# Patient Record
Sex: Female | Born: 1960 | Race: White | Hispanic: No | Marital: Married | Smoking: Never smoker
Health system: Southern US, Community
[De-identification: ages and names within clinical notes are randomized; demographics above are authoritative.]

## PROBLEM LIST (undated history)

## (undated) DIAGNOSIS — E7800 Pure hypercholesterolemia, unspecified: Secondary | ICD-10-CM

## (undated) DIAGNOSIS — E559 Vitamin D deficiency, unspecified: Secondary | ICD-10-CM

## (undated) HISTORY — DX: Pure hypercholesterolemia, unspecified: E78.00

## (undated) HISTORY — PX: TONSILLECTOMY: SUR1361

---

## 2013-05-12 ENCOUNTER — Emergency Department (HOSPITAL_BASED_OUTPATIENT_CLINIC_OR_DEPARTMENT_OTHER)
Admission: EM | Admit: 2013-05-12 | Discharge: 2013-05-12 | Disposition: A | Discharge status: Home / Self Care | Payer: BC Managed Care – PPO | Attending: Emergency Medicine | Admitting: Emergency Medicine

## 2013-05-12 ENCOUNTER — Encounter (HOSPITAL_BASED_OUTPATIENT_CLINIC_OR_DEPARTMENT_OTHER): Payer: Self-pay | Admitting: Emergency Medicine

## 2013-05-12 ENCOUNTER — Emergency Department (HOSPITAL_BASED_OUTPATIENT_CLINIC_OR_DEPARTMENT_OTHER): Payer: BC Managed Care – PPO

## 2013-05-12 DIAGNOSIS — R079 Chest pain, unspecified: Secondary | ICD-10-CM | POA: Insufficient documentation

## 2013-05-12 DIAGNOSIS — K219 Gastro-esophageal reflux disease without esophagitis: Secondary | ICD-10-CM | POA: Insufficient documentation

## 2013-05-12 DIAGNOSIS — Z79899 Other long term (current) drug therapy: Secondary | ICD-10-CM | POA: Insufficient documentation

## 2013-05-12 DIAGNOSIS — M549 Dorsalgia, unspecified: Secondary | ICD-10-CM | POA: Insufficient documentation

## 2013-05-12 DIAGNOSIS — R209 Unspecified disturbances of skin sensation: Secondary | ICD-10-CM | POA: Insufficient documentation

## 2013-05-12 LAB — PROTIME-INR
INR: 0.95 (ref 0.00–1.49)
PROTHROMBIN TIME: 12.5 s (ref 11.6–15.2)

## 2013-05-12 LAB — APTT: APTT: 29 s (ref 24–37)

## 2013-05-12 LAB — BASIC METABOLIC PANEL
BUN: 9 mg/dL (ref 6–23)
CALCIUM: 10 mg/dL (ref 8.4–10.5)
CO2: 27 meq/L (ref 19–32)
Chloride: 101 mEq/L (ref 96–112)
Creatinine, Ser: 0.8 mg/dL (ref 0.50–1.10)
GFR calc Af Amer: >90 mL/min (ref >90)
GFR calc non Af Amer: 83 mL/min — ABNORMAL LOW (ref >90)
Glucose, Bld: 92 mg/dL (ref 70–99)
POTASSIUM: 3.8 meq/L (ref 3.7–5.3)
SODIUM: 142 meq/L (ref 137–147)

## 2013-05-12 LAB — TROPONIN I: Troponin I: <0.3 ng/mL (ref <0.30)

## 2013-05-12 LAB — CBC WITH DIFFERENTIAL/PLATELET
BASOS ABS: 0 10*3/uL (ref 0.0–0.1)
Basophils Relative: 0 % (ref 0–1)
Eosinophils Absolute: 0.2 10*3/uL (ref 0.0–0.7)
Eosinophils Relative: 3 % (ref 0–5)
HCT: 41.6 % (ref 36.0–46.0)
Hemoglobin: 13.7 g/dL (ref 12.0–15.0)
LYMPHS PCT: 36 % (ref 12–46)
Lymphs Abs: 2.7 10*3/uL (ref 0.7–4.0)
MCH: 30.5 pg (ref 26.0–34.0)
MCHC: 32.9 g/dL (ref 30.0–36.0)
MCV: 92.7 fL (ref 78.0–100.0)
MONOS PCT: 10 % (ref 3–12)
Monocytes Absolute: 0.7 10*3/uL (ref 0.1–1.0)
NEUTROS PCT: 51 % (ref 43–77)
Neutro Abs: 3.8 10*3/uL (ref 1.7–7.7)
PLATELETS: 301 10*3/uL (ref 150–400)
RBC: 4.49 MIL/uL (ref 3.87–5.11)
RDW: 12.8 % (ref 11.5–15.5)
WBC: 7.4 10*3/uL (ref 4.0–10.5)

## 2013-05-12 MED ORDER — ONDANSETRON 4 MG PO TBDP: 4.0000 mg | ORAL_TABLET | Freq: Three times a day (TID) | ORAL | Status: AC | PRN

## 2013-05-12 MED ORDER — GI COCKTAIL ~~LOC~~
30.0000 mL | Freq: Once | ORAL | Status: AC
  Administered 2013-05-12: 30 mL via ORAL
  Filled 2013-05-12: qty 30

## 2013-05-12 MED ORDER — PANTOPRAZOLE SODIUM 40 MG IV SOLR
40.0000 mg | Freq: Once | INTRAVENOUS | Status: AC
  Administered 2013-05-12: 40 mg via INTRAVENOUS
  Filled 2013-05-12: qty 40

## 2013-05-12 MED ORDER — OMEPRAZOLE 20 MG PO CPDR: 20.0000 mg | DELAYED_RELEASE_CAPSULE | Freq: Two times a day (BID) | ORAL | Status: AC

## 2013-05-12 NOTE — ED Notes (Signed)
Anterior chest tightness into her upper back x 1 hour. Her left arm is numb.

## 2013-05-12 NOTE — ED Provider Notes (Signed)
CSN: [id number]     Arrival date & time 05/12/13  1957 History  This chart was scribed for Rolland Porter, MD by Blanchard Kelch, ED Scribe. The patient was seen in room MH11/MH11. Patient's care was started at 8:27 PM.    Chief Complaint  Patient presents with  . Chest Pain    Patient is a 53 y.o. female presenting with chest pain. The history is provided by the patient. No language interpreter was used.  Chest Pain Associated symptoms: numbness   Associated symptoms: no abdominal pain, no cough, no diaphoresis, no dizziness, no dysphagia, no fatigue, no fever, no headache, no nausea, no shortness of breath and not vomiting     HPI Comments: Virginia Jensen is a 53 y.o. female who presents to the Emergency Department complaining of intermittent episodes of chest pain that first began two days ago. She states that the first episode occurred two days ago suddenly while she was bending over. She described the pain as a severe pressure in the middle of the chest that gradually subsided after fifteen minutes. She states that she sat down and changed positions which she believed caused relief. Yesterday was normal without episodes. She states that she felt a burning sensation today for about five minutes. She also started getting pain in her back between the shoulder blades and a numbness down her left arm that lasted for about an hour after eating baked chicken for dinner. She has a history of recurrent left back pain that also came back within the past few days. She states this pain is still present. She denies nausea, diaphoresis or becoming pale during the episodes. She denies any prior similar episodes of pain or issues with eating certain types of food. She denies a history of heart problems but reports significant history of family heart problems in her parents. She denies any heart problems in her siblings. She denies a past history diabetes or hypertension. She has been taking Sudafed for the past  three weeks. She was taking NSAIDs regularly for left ankle pain but stopped taking them on a regular basis in October. She drinks wine occasionally. She drinks caffeine everyday and has recently increased her caffeine dosage. She denies smoking, current or past. She states that she typically exercises by walking thirty minutes to an hour intermittently.    History reviewed. No pertinent past medical history. Past Surgical History  Procedure Laterality Date  . Tonsillectomy     No family history on file. History  Substance Use Topics  . Smoking status: Never Smoker   . Smokeless tobacco: Not on file  . Alcohol Use: Yes   OB History   Grav Para Term Preterm Abortions TAB SAB Ect Mult Living                 Review of Systems  Constitutional: Negative for fever, chills, diaphoresis, appetite change and fatigue.  HENT: Negative for mouth sores, sore throat and trouble swallowing.   Eyes: Negative for visual disturbance.  Respiratory: Negative for cough, chest tightness, shortness of breath and wheezing.   Cardiovascular: Positive for chest pain.  Gastrointestinal: Negative for nausea, vomiting, abdominal pain, diarrhea and abdominal distention.  Endocrine: Negative for polydipsia, polyphagia and polyuria.  Genitourinary: Negative for dysuria, frequency and hematuria.  Musculoskeletal: Negative for gait problem.  Skin: Negative for color change, pallor and rash.  Neurological: Positive for numbness. Negative for dizziness, syncope, light-headedness and headaches.  Hematological: Does not bruise/bleed easily.  Psychiatric/Behavioral: Negative for behavioral problems  and confusion.    Allergies  Review of patient's allergies indicates no known allergies.  Home Medications   Current Outpatient Rx  Name  Route  Sig  Dispense  Refill  . omeprazole (PRILOSEC) 20 MG capsule   Oral   Take 1 capsule (20 mg total) by mouth 2 (two) times daily.   60 capsule   0   . ondansetron  (ZOFRAN ODT) 4 MG disintegrating tablet   Oral   Take 1 tablet (4 mg total) by mouth every 8 (eight) hours as needed for nausea.   20 tablet   0    Triage Vitals: BP 152/65  Pulse 73  Temp(Src) 98.8 F (37.1 C) (Oral)  Resp 18  Ht 5\' 7"  (1.702 m)  Wt 175 lb (79.379 kg)  BMI 27.40 kg/m2  SpO2 100%  Physical Exam  Nursing note and vitals reviewed. Constitutional: She is oriented to person, place, and time. She appears well-developed and well-nourished. No distress.  HENT:  Head: Normocephalic.  Eyes: Conjunctivae are normal. Pupils are equal, round, and reactive to light. No scleral icterus.  Neck: Normal range of motion. Neck supple. No thyromegaly present.  No carotid bruit.  Cardiovascular: Normal rate, regular rhythm and normal heart sounds.  Exam reveals no gallop and no friction rub.   No murmur heard. Pulmonary/Chest: Effort normal and breath sounds normal. No respiratory distress. She has no wheezes. She has no rales.  Abdominal: Soft. Bowel sounds are normal. She exhibits no distension. There is no tenderness. There is no rebound.  Musculoskeletal: Normal range of motion.  Neurological: She is alert and oriented to person, place, and time.  Skin: Skin is warm and dry. No rash noted.  No zoster or ulcerations.  Psychiatric: She has a normal mood and affect. Her behavior is normal.    ED Course  Procedures (including critical care time)  DIAGNOSTIC STUDIES: Oxygen Saturation is 100% on room air, normal by my interpretation.    COORDINATION OF CARE: 8:41 PM -Will order chest x-ray, Protime-INR, APTT, Troponin I and BMP. Patient verbalizes understanding and agrees with treatment plan.    Labs Review Labs Reviewed  BASIC METABOLIC PANEL - Abnormal; Notable for the following:    GFR calc non Af Amer 83 (*)    All other components within normal limits  TROPONIN I  CBC WITH DIFFERENTIAL  PROTIME-INR  APTT  TROPONIN I   Imaging Review Dg Chest 2  View  05/12/2013   CLINICAL DATA:  Chest tightness.  Left upper extremity numbness.  EXAM: CHEST  2 VIEW  COMPARISON:  None.  FINDINGS: Cardiomediastinal silhouette unremarkable. Lungs clear. Bronchovascular markings normal. Pulmonary vascularity normal. No pneumothorax. No pleural effusions. Visualized bony thorax intact.  IMPRESSION: Normal examination.   Electronically Signed   By: Hulan Saashomas  Lawrence M.D.   On: 05/12/2013 21:00    EKG Interpretation   None       MDM   1. Chest pain   2. GERD (gastroesophageal reflux disease)     Negative enzymes. Normal resting EKG. Symptoms started with leaning over an sounds quite like reflux. She is low risk for this being cardiac. I recommend outpatient cardiac testing. We'll start her on proton pump inhibitors. Her given her careful precautions to return if any worsening or recurrence of symptoms.  I personally performed the services described in this documentation, which was scribed in my presence. The recorded information has been reviewed and is accurate.   Rolland PorterMark Kennia Vanvorst, MD 05/12/13 2329

## 2013-05-12 NOTE — Discharge Instructions (Signed)
Call [facility] Cardiology for follow up appointment. Return to emergency room if your symptoms worsen or progress or if you develop shortness of breath dizziness lightheadedness sweating. Your treatment is aimed at treating reflux. If your symptoms become different than those discussed today to recheck immediately.  Chest Pain (Nonspecific) It is often hard to give a specific diagnosis for the cause of chest pain. There is always a chance that your pain could be related to something serious, such as a heart attack or a blood clot in the lungs. You need to follow up with your caregiver for further evaluation. CAUSES   Heartburn.  Pneumonia or bronchitis.  Anxiety or stress.  Inflammation around your heart (pericarditis) or lung (pleuritis or pleurisy).  A blood clot in the lung.  A collapsed lung (pneumothorax). It can develop suddenly on its own (spontaneous pneumothorax) or from injury (trauma) to the chest.  Shingles infection (herpes zoster virus). The chest wall is composed of bones, muscles, and cartilage. Any of these can be the source of the pain.  The bones can be bruised by injury.  The muscles or cartilage can be strained by coughing or overwork.  The cartilage can be affected by inflammation and become sore (costochondritis). DIAGNOSIS  Lab tests or other studies, such as X-rays, electrocardiography, stress testing, or cardiac imaging, may be needed to find the cause of your pain.  TREATMENT   Treatment depends on what may be causing your chest pain. Treatment may include:  Acid blockers for heartburn.  Anti-inflammatory medicine.  Pain medicine for inflammatory conditions.  Antibiotics if an infection is present.  You may be advised to change lifestyle habits. This includes stopping smoking and avoiding alcohol, caffeine, and chocolate.  You may be advised to keep your head raised (elevated) when sleeping. This reduces the chance of acid going backward from your  stomach into your esophagus.  Most of the time, nonspecific chest pain will improve within 2 to 3 days with rest and mild pain medicine. HOME CARE INSTRUCTIONS   If antibiotics were prescribed, take your antibiotics as directed. Finish them even if you start to feel better.  For the next few days, avoid physical activities that bring on chest pain. Continue physical activities as directed.  Do not smoke.  Avoid drinking alcohol.  Only take over-the-counter or prescription medicine for pain, discomfort, or fever as directed by your caregiver.  Follow your caregiver's suggestions for further testing if your chest pain does not go away.  Keep any follow-up appointments you made. If you do not go to an appointment, you could develop lasting (chronic) problems with pain. If there is any problem keeping an appointment, you must call to reschedule. SEEK MEDICAL CARE IF:   You think you are having problems from the medicine you are taking. Read your medicine instructions carefully.  Your chest pain does not go away, even after treatment.  You develop a rash with blisters on your chest. SEEK IMMEDIATE MEDICAL CARE IF:   You have increased chest pain or pain that spreads to your arm, neck, jaw, back, or abdomen.  You develop shortness of breath, an increasing cough, or you are coughing up blood.  You have severe back or abdominal pain, feel nauseous, or vomit.  You develop severe weakness, fainting, or chills.  You have a fever. THIS IS AN EMERGENCY. Do not wait to see if the pain will go away. Get medical help at once. Call your local emergency services (911 in [place] Do not  drive yourself to the hospital. MAKE SURE YOU:   Understand these instructions.  Will watch your condition.  Will get help right away if you are not doing well or get worse. Document Released: 01/07/2005 Document Revised: 06/22/2011 Document Reviewed: 11/03/2007 [facility] Patient Information 2014 [place],  [place].  Diet for Gastroesophageal Reflux Disease, Adult Reflux (acid reflux) is when acid from your stomach flows up into the esophagus. When acid comes in contact with the esophagus, the acid causes irritation and soreness (inflammation) in the esophagus. When reflux happens often or so severely that it causes damage to the esophagus, it is called gastroesophageal reflux disease (GERD). Nutrition therapy can help ease the discomfort of GERD. FOODS OR DRINKS TO AVOID OR LIMIT  Smoking or chewing tobacco. Nicotine is one of the most potent stimulants to acid production in the gastrointestinal tract.  Caffeinated and decaffeinated coffee and black tea.  Regular or low-calorie carbonated beverages or energy drinks (caffeine-free carbonated beverages are allowed).   Strong spices, such as black pepper, white pepper, red pepper, cayenne, curry powder, and chili powder.  Peppermint or spearmint.  Chocolate.  High-fat foods, including meats and fried foods. Extra added fats including oils, butter, salad dressings, and nuts. Limit these to less than 8 tsp per day.  Fruits and vegetables if they are not tolerated, such as citrus fruits or tomatoes.  Alcohol.  Any food that seems to aggravate your condition. If you have questions regarding your diet, call your caregiver or a registered dietitian. OTHER THINGS THAT MAY HELP GERD INCLUDE:   Eating your meals slowly, in a relaxed setting.  Eating 5 to 6 small meals per day instead of 3 large meals.  Eliminating food for a period of time if it causes distress.  Not lying down until 3 hours after eating a meal.  Keeping the head of your bed raised 6 to 9 inches (15 to 23 cm) by using a foam wedge or blocks under the legs of the bed. Lying flat may make symptoms worse.  Being physically active. Weight loss may be helpful in reducing reflux in overweight or obese adults.  Wear loose fitting clothing EXAMPLE MEAL PLAN This meal plan is  approximately 2,000 calories based on https://www.bernard.org/ meal planning guidelines. Breakfast   cup cooked oatmeal.  1 cup strawberries.  1 cup low-fat milk.  1 oz almonds. Snack  1 cup cucumber slices.  6 oz yogurt (made from low-fat or fat-free milk). Lunch  2 slice whole-wheat bread.  2 oz sliced [place].  2 tsp mayonnaise.  1 cup blueberries.  1 cup snap peas. Snack  6 whole-wheat crackers.  1 oz string cheese. Dinner   cup brown rice.  1 cup mixed veggies.  1 tsp olive oil.  3 oz grilled fish. Document Released: 03/30/2005 Document Revised: 06/22/2011 Document Reviewed: 02/13/2011 [facility] Patient Information 2014 [place], [place].  Gastroesophageal Reflux Disease, Adult Gastroesophageal reflux disease (GERD) happens when acid from your stomach flows up into the esophagus. When acid comes in contact with the esophagus, the acid causes soreness (inflammation) in the esophagus. Over time, GERD may create small holes (ulcers) in the lining of the esophagus. CAUSES   Increased body weight. This puts pressure on the stomach, making acid rise from the stomach into the esophagus.  Smoking. This increases acid production in the stomach.  Drinking alcohol. This causes decreased pressure in the lower esophageal sphincter (valve or ring of muscle between the esophagus and stomach), allowing acid from the stomach into the  esophagus.  Late evening meals and a full stomach. This increases pressure and acid production in the stomach.  A malformed lower esophageal sphincter. Sometimes, no cause is found. SYMPTOMS   Burning pain in the lower part of the mid-chest behind the breastbone and in the mid-stomach area. This may occur twice a week or more often.  Trouble swallowing.  Sore throat.  Dry cough.  Asthma-like symptoms including chest tightness, shortness of breath, or wheezing. DIAGNOSIS  Your caregiver may be able to diagnose GERD based on your symptoms.  In some cases, X-rays and other tests may be done to check for complications or to check the condition of your stomach and esophagus. TREATMENT  Your caregiver may recommend over-the-counter or prescription medicines to help decrease acid production. Ask your caregiver before starting or adding any new medicines.  HOME CARE INSTRUCTIONS   Change the factors that you can control. Ask your caregiver for guidance concerning weight loss, quitting smoking, and alcohol consumption.  Avoid foods and drinks that make your symptoms worse, such as:  Caffeine or alcoholic drinks.  Chocolate.  Peppermint or mint flavorings.  Garlic and onions.  Spicy foods.  Citrus fruits, such as oranges, lemons, or limes.  Tomato-based foods such as sauce, chili, salsa, and pizza.  Fried and fatty foods.  Avoid lying down for the 3 hours prior to your bedtime or prior to taking a nap.  Eat small, frequent meals instead of large meals.  Wear loose-fitting clothing. Do not wear anything tight around your waist that causes pressure on your stomach.  Raise the head of your bed 6 to 8 inches with wood blocks to help you sleep. Extra pillows will not help.  Only take over-the-counter or prescription medicines for pain, discomfort, or fever as directed by your caregiver.  Do not take aspirin, ibuprofen, or other nonsteroidal anti-inflammatory drugs (NSAIDs). SEEK IMMEDIATE MEDICAL CARE IF:   You have pain in your arms, neck, jaw, teeth, or back.  Your pain increases or changes in intensity or duration.  You develop nausea, vomiting, or sweating (diaphoresis).  You develop shortness of breath, or you faint.  Your vomit is green, yellow, black, or looks like coffee grounds or blood.  Your stool is red, bloody, or black. These symptoms could be signs of other problems, such as heart disease, gastric bleeding, or esophageal bleeding. MAKE SURE YOU:   Understand these instructions.  Will watch your  condition.  Will get help right away if you are not doing well or get worse. Document Released: 01/07/2005 Document Revised: 06/22/2011 Document Reviewed: 10/17/2010 [facility] Patient Information 2014 [place].

## 2013-05-12 NOTE — ED Notes (Signed)
Patient transported to X-ray 

## 2015-03-09 IMAGING — CR DG CHEST 2V
2 series · 2 of 2 positions shown · non-contrast
Comparison: None.

CLINICAL DATA: Chest tightness.  Left upper extremity numbness.

EXAM:
CHEST  2 VIEW

[w chest pa]
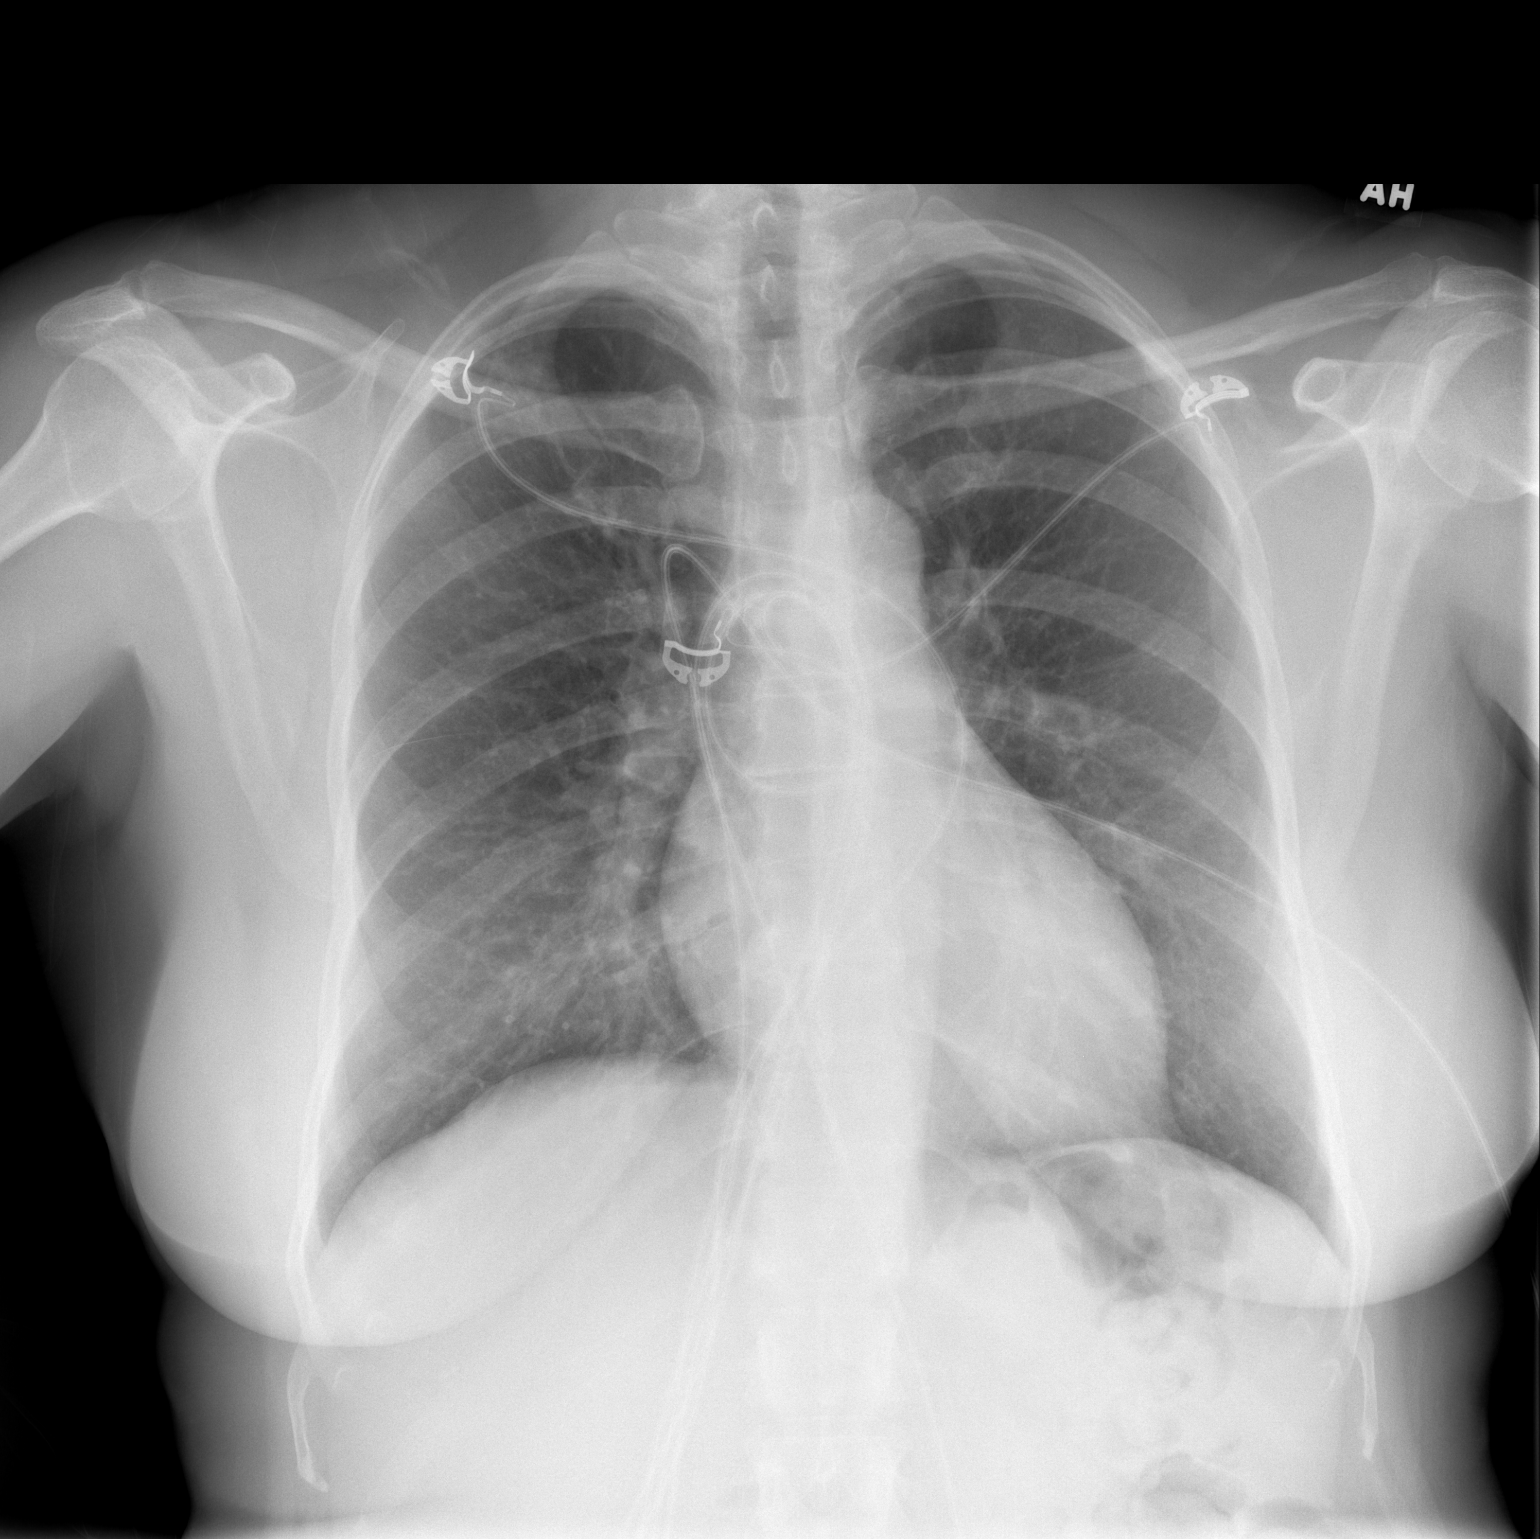

[w chest lat]
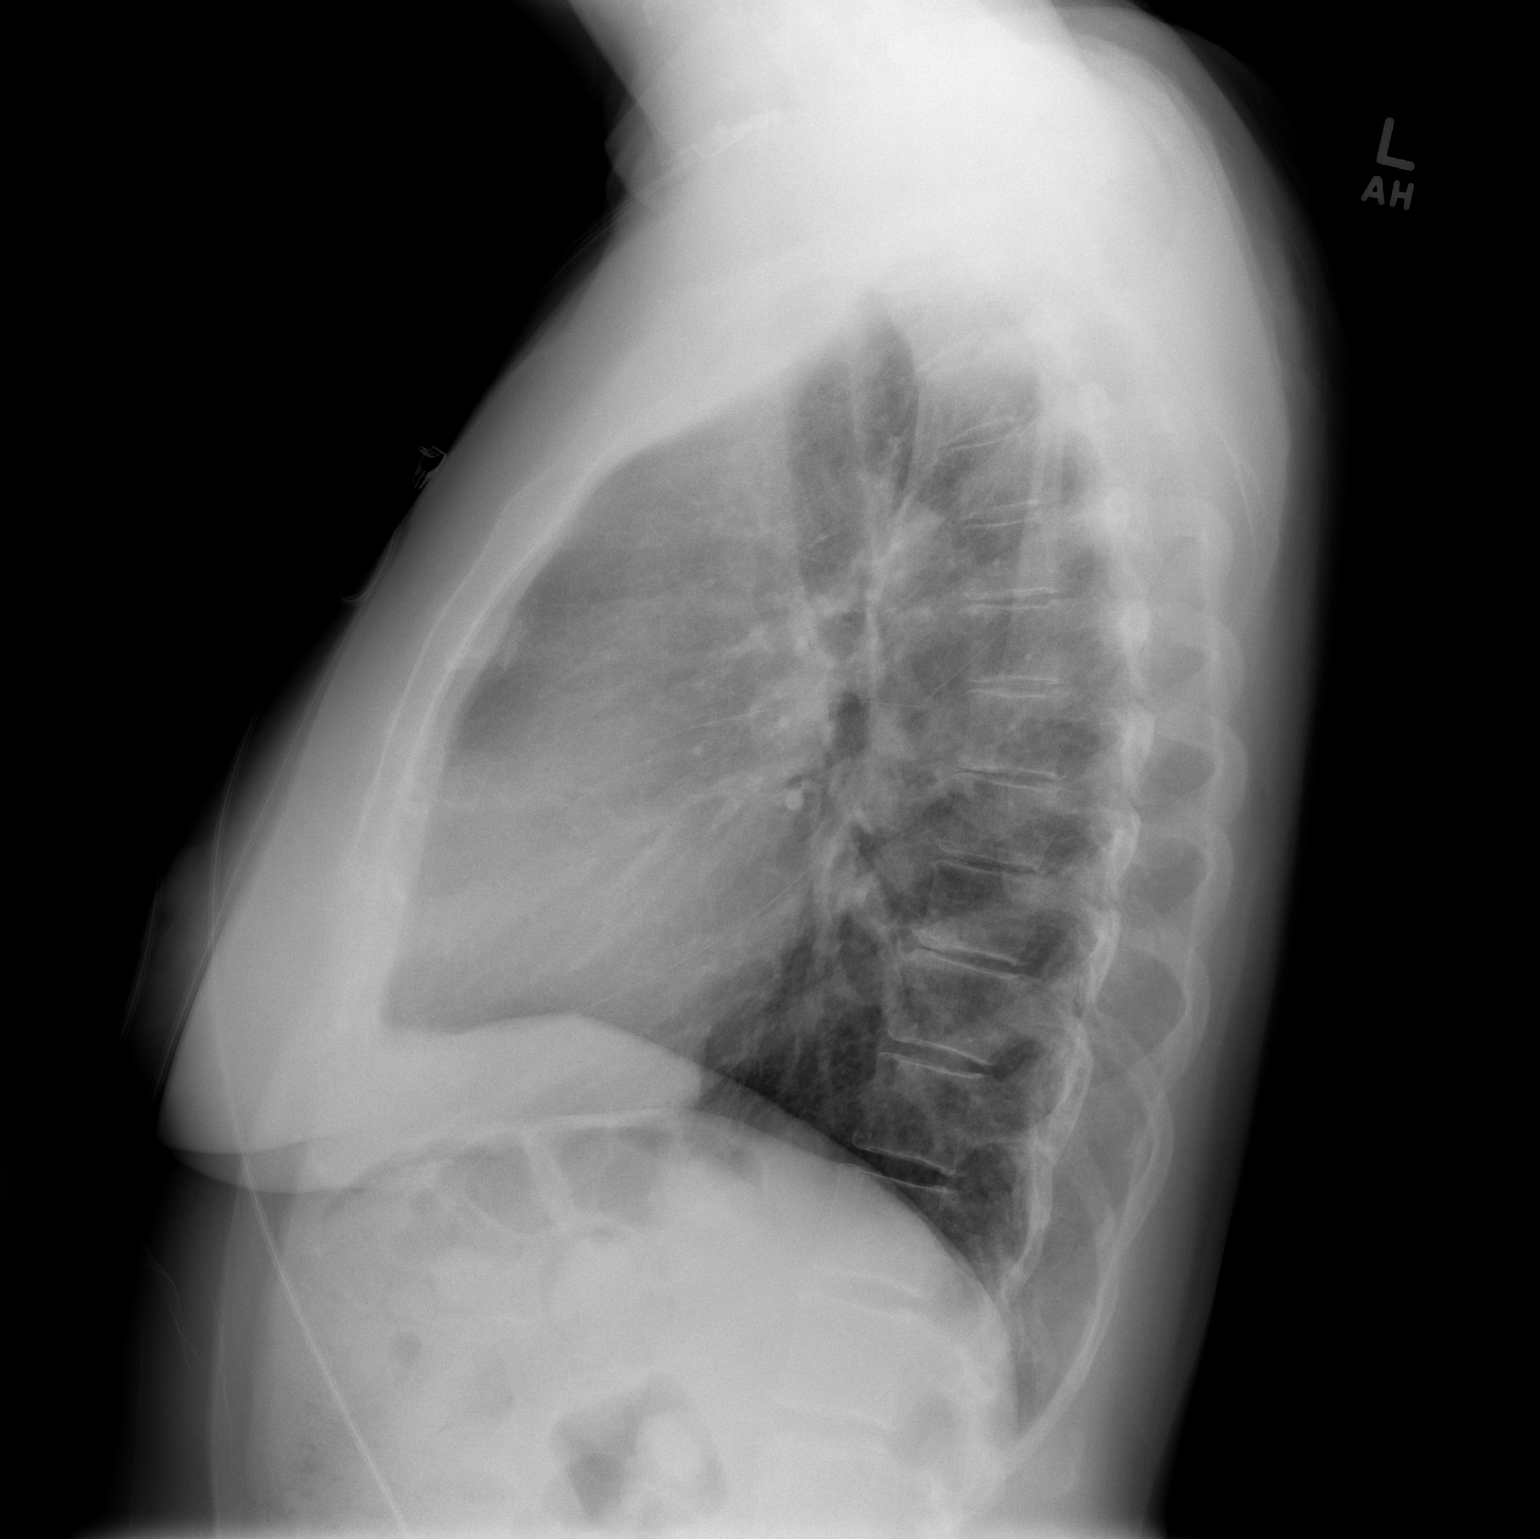

[2 of 2 positions shown; findings below may reference images not displayed]

FINDINGS: Cardiomediastinal silhouette unremarkable. Lungs clear.
Bronchovascular markings normal. Pulmonary vascularity normal. No
pneumothorax. No pleural effusions. Visualized bony thorax intact.
IMPRESSION: Normal examination.

## 2016-05-12 ENCOUNTER — Other Ambulatory Visit (HOSPITAL_COMMUNITY): Payer: Self-pay | Admitting: Orthopedic Surgery

## 2016-05-12 DIAGNOSIS — M79605 Pain in left leg: Secondary | ICD-10-CM

## 2016-05-12 DIAGNOSIS — M7989 Other specified soft tissue disorders: Principal | ICD-10-CM

## 2016-05-13 ENCOUNTER — Ambulatory Visit (HOSPITAL_COMMUNITY)
Admission: RE | Admit: 2016-05-13 | Discharge: 2016-05-13 | Disposition: A | Discharge status: Home / Self Care | Payer: BLUE CROSS/BLUE SHIELD | Source: Ambulatory Visit | Attending: Orthopedic Surgery | Admitting: Orthopedic Surgery

## 2016-05-13 DIAGNOSIS — M79605 Pain in left leg: Secondary | ICD-10-CM | POA: Insufficient documentation

## 2016-05-13 DIAGNOSIS — M7989 Other specified soft tissue disorders: Secondary | ICD-10-CM | POA: Diagnosis present

## 2016-05-13 DIAGNOSIS — M7122 Synovial cyst of popliteal space [Baker], left knee: Secondary | ICD-10-CM | POA: Diagnosis not present

## 2016-05-13 NOTE — Progress Notes (Signed)
*  PRELIMINARY RESULTS* Vascular Ultrasound Left lower extremity venous duplex has been completed.  Preliminary findings: no evidence of DVT left baker's cyst noted.   Called results to Dr. Eulah PontMurphy.  Farrel DemarkJill Eunice, RDMS, RVT  05/13/2016, 10:20 AM

## 2019-04-28 ENCOUNTER — Encounter (HOSPITAL_BASED_OUTPATIENT_CLINIC_OR_DEPARTMENT_OTHER): Payer: Self-pay

## 2019-04-28 ENCOUNTER — Emergency Department (HOSPITAL_BASED_OUTPATIENT_CLINIC_OR_DEPARTMENT_OTHER)
Admission: EM | Admit: 2019-04-28 | Discharge: 2019-04-28 | Disposition: A | Discharge status: Home / Self Care | Payer: BLUE CROSS/BLUE SHIELD | Attending: Emergency Medicine | Admitting: Emergency Medicine

## 2019-04-28 ENCOUNTER — Emergency Department (HOSPITAL_BASED_OUTPATIENT_CLINIC_OR_DEPARTMENT_OTHER): Payer: BLUE CROSS/BLUE SHIELD

## 2019-04-28 ENCOUNTER — Other Ambulatory Visit: Payer: Self-pay

## 2019-04-28 DIAGNOSIS — Z79899 Other long term (current) drug therapy: Secondary | ICD-10-CM | POA: Diagnosis not present

## 2019-04-28 DIAGNOSIS — R1032 Left lower quadrant pain: Secondary | ICD-10-CM

## 2019-04-28 DIAGNOSIS — N390 Urinary tract infection, site not specified: Secondary | ICD-10-CM | POA: Diagnosis not present

## 2019-04-28 HISTORY — DX: Vitamin D deficiency, unspecified: E55.9

## 2019-04-28 LAB — CBC WITH DIFFERENTIAL/PLATELET
Abs Immature Granulocytes: 0.01 10*3/uL (ref 0.00–0.07)
Basophils Absolute: 0 10*3/uL (ref 0.0–0.1)
Basophils Relative: 1 %
Eosinophils Absolute: 0.2 10*3/uL (ref 0.0–0.5)
Eosinophils Relative: 3 %
HCT: 39.5 % (ref 36.0–46.0)
Hemoglobin: 12.9 g/dL (ref 12.0–15.0)
Immature Granulocytes: 0 %
Lymphocytes Relative: 33 %
Lymphs Abs: 2.2 10*3/uL (ref 0.7–4.0)
MCH: 30.4 pg (ref 26.0–34.0)
MCHC: 32.7 g/dL (ref 30.0–36.0)
MCV: 93.2 fL (ref 80.0–100.0)
Monocytes Absolute: 0.7 10*3/uL (ref 0.1–1.0)
Monocytes Relative: 11 %
Neutro Abs: 3.4 10*3/uL (ref 1.7–7.7)
Neutrophils Relative %: 52 %
Platelets: 274 10*3/uL (ref 150–400)
RBC: 4.24 MIL/uL (ref 3.87–5.11)
RDW: 12.4 % (ref 11.5–15.5)
WBC: 6.5 10*3/uL (ref 4.0–10.5)
nRBC: 0 % (ref 0.0–0.2)

## 2019-04-28 LAB — COMPREHENSIVE METABOLIC PANEL
ALT: 22 U/L (ref 0–44)
AST: 16 U/L (ref 15–41)
Albumin: 4.2 g/dL (ref 3.5–5.0)
Alkaline Phosphatase: 50 U/L (ref 38–126)
Anion gap: 7 (ref 5–15)
BUN: 13 mg/dL (ref 6–20)
CO2: 27 mmol/L (ref 22–32)
Calcium: 9.2 mg/dL (ref 8.9–10.3)
Chloride: 106 mmol/L (ref 98–111)
Creatinine, Ser: 0.65 mg/dL (ref 0.44–1.00)
GFR calc Af Amer: >60 mL/min (ref >60)
GFR calc non Af Amer: >60 mL/min (ref >60)
Glucose, Bld: 122 mg/dL — ABNORMAL HIGH (ref 70–99)
Potassium: 3.6 mmol/L (ref 3.5–5.1)
Sodium: 140 mmol/L (ref 135–145)
Total Bilirubin: 0.4 mg/dL (ref 0.3–1.2)
Total Protein: 7 g/dL (ref 6.5–8.1)

## 2019-04-28 LAB — URINALYSIS, ROUTINE W REFLEX MICROSCOPIC
Bilirubin Urine: NEGATIVE
Glucose, UA: NEGATIVE mg/dL
Hgb urine dipstick: NEGATIVE
Ketones, ur: NEGATIVE mg/dL
Nitrite: NEGATIVE
Protein, ur: NEGATIVE mg/dL
Specific Gravity, Urine: 1.02 (ref 1.005–1.030)
pH: 7.5 (ref 5.0–8.0)

## 2019-04-28 LAB — URINALYSIS, MICROSCOPIC (REFLEX): RBC / HPF: NONE SEEN RBC/hpf (ref 0–5)

## 2019-04-28 LAB — LIPASE, BLOOD: Lipase: 24 U/L (ref 11–51)

## 2019-04-28 MED ORDER — CEPHALEXIN 500 MG PO CAPS: 1000.0000 mg | ORAL_CAPSULE | Freq: Two times a day (BID) | ORAL | 0 refills | Status: AC

## 2019-04-28 MED ORDER — IOHEXOL 300 MG/ML  SOLN
100.0000 mL | Freq: Once | INTRAMUSCULAR | Status: AC | PRN
  Administered 2019-04-28: 100 mL via INTRAVENOUS

## 2019-04-28 MED ORDER — SODIUM CHLORIDE 0.9 % IV BOLUS
500.0000 mL | Freq: Once | INTRAVENOUS | Status: AC
  Administered 2019-04-28: 500 mL via INTRAVENOUS

## 2019-04-28 MED ORDER — CEPHALEXIN 250 MG PO CAPS
1000.0000 mg | ORAL_CAPSULE | Freq: Once | ORAL | Status: AC
  Administered 2019-04-28: 23:00:00 1000 mg via ORAL
  Filled 2019-04-28: qty 4

## 2019-04-28 NOTE — ED Notes (Signed)
Pt ambulated to bathroom with steady gait. 

## 2019-04-28 NOTE — ED Provider Notes (Signed)
[facility] EMERGENCY DEPARTMENT Provider Note   CSN: [id number] Arrival date & time: 04/28/19  1618     History Chief Complaint  Patient presents with  . Abdominal Pain    Virginia Jensen is a 59 y.o. female.  HPI Patient had a colonoscopy 2 days ago.  She has been having left lower quadrant pain that has persisted and worsened.  No associated symptoms.  Patient has not had a bowel movement yet.  No vomiting.  No fever.  No urinary symptoms.  Patient was sent by her gastroenterologist for ED evaluation.    Past Medical History:  Diagnosis Date  . Vitamin D deficiency     There are no problems to display for this patient.   Past Surgical History:  Procedure Laterality Date  . TONSILLECTOMY       OB History   No obstetric history on file.     No family history on file.  Social History   Tobacco Use  . Smoking status: Never Smoker  . Smokeless tobacco: Never Used  Substance Use Topics  . Alcohol use: Yes    Comment: occ  . Drug use: No    Home Medications Prior to Admission medications   Medication Sig Start Date End Date Taking? Authorizing Provider  cephALEXin (KEFLEX) 500 MG capsule Take 2 capsules (1,000 mg total) by mouth 2 (two) times daily. 04/28/19   Arby Barrette, MD  omeprazole (PRILOSEC) 20 MG capsule Take 1 capsule (20 mg total) by mouth 2 (two) times daily. 05/12/13   Rolland Porter, MD  ondansetron (ZOFRAN ODT) 4 MG disintegrating tablet Take 1 tablet (4 mg total) by mouth every 8 (eight) hours as needed for nausea. 05/12/13   Rolland Porter, MD    Allergies    Patient has no known allergies.  Review of Systems   Review of Systems 10 Systems reviewed and are negative for acute change except as noted in the HPI.  Physical Exam Updated Vital Signs BP 126/68 (BP Location: Right Arm)   Pulse 67   Temp 98.5 F (36.9 C) (Oral)   Resp 20   SpO2 100%   Physical Exam Constitutional:      Appearance: She is well-developed.  HENT:    Head: Normocephalic and atraumatic.  Cardiovascular:     Rate and Rhythm: Normal rate and regular rhythm.     Heart sounds: Normal heart sounds.  Pulmonary:     Effort: Pulmonary effort is normal.     Breath sounds: Normal breath sounds.  Abdominal:     General: Bowel sounds are normal. There is no distension.     Palpations: Abdomen is soft.     Tenderness: There is abdominal tenderness.     Comments: Left lower quadrant tender to palpation.  No guarding.  Musculoskeletal:        General: Normal range of motion.     Cervical back: Neck supple.     Right lower leg: No edema.     Left lower leg: No edema.  Skin:    General: Skin is warm and dry.  Neurological:     Mental Status: She is alert and oriented to person, place, and time.     GCS: GCS eye subscore is 4. GCS verbal subscore is 5. GCS motor subscore is 6.     Coordination: Coordination normal.     ED Results / Procedures / Treatments   Labs (all labs ordered are listed, but only abnormal results are displayed) Labs Reviewed  URINE CULTURE - Abnormal; Notable for the following components:      Result Value   Culture MULTIPLE SPECIES PRESENT, SUGGEST RECOLLECTION (*)    All other components within normal limits  COMPREHENSIVE METABOLIC PANEL - Abnormal; Notable for the following components:   Glucose, Bld 122 (*)    All other components within normal limits  URINALYSIS, ROUTINE W REFLEX MICROSCOPIC - Abnormal; Notable for the following components:   APPearance CLOUDY (*)    Leukocytes,Ua TRACE (*)    All other components within normal limits  URINALYSIS, MICROSCOPIC (REFLEX) - Abnormal; Notable for the following components:   Bacteria, UA MANY (*)    All other components within normal limits  LIPASE, BLOOD  CBC WITH DIFFERENTIAL/PLATELET    EKG None  Radiology No results found.  Procedures Procedures (including critical care time)  Medications Ordered in ED Medications  sodium chloride 0.9 % bolus 500  mL (0 mLs Intravenous Stopped 04/28/19 2015)  iohexol (OMNIPAQUE) 300 MG/ML solution 100 mL (100 mLs Intravenous Contrast Given 04/28/19 2054)  cephALEXin (KEFLEX) capsule 1,000 mg (1,000 mg Oral Given 04/28/19 2324)    ED Course  I have reviewed the triage vital signs and the nursing notes.  Pertinent labs & imaging results that were available during my care of the patient were reviewed by me and considered in my medical decision making (see chart for details).    MDM Rules/Calculators/A&P                      Patient has persisting and worsening left lower abdominal pain 2 days post colonoscopy.  Clinically she is well in appearance.  Abdomen is soft without guarding but does have reproducible left lower quadrant pain.  We will proceed with CT abdomen to rule out any post colonoscopy complications or other etiologies.  CT identifies no acute findings.  Patient is clinically well without fevers or vomiting or diarrhea.  Urinalysis suggestive of UTI however, specimen does contain fair amount of squamous epithelial cells and may represent contamination.  Will proceed with urine culture and treat empirically with Keflex due to patient having lower abdominal pain without other identifiable etiology.  Patient counseled on UA findings and anticipated culture in 24 to 48 hours.  Advised to contact her gynecologist or GI doctor to review results of culture and determine necessity of ongoing antibiotic use.  Patient was very dismayed as there was no definitive etiology for her abdominal pain.  We discussed the sometimes ambiguous nature of abdominal pain, and the process of ruling out serious etiologies, while continuing diagnostic evaluation with her providers, and return precautions for return to the emergency department. Final Clinical Impression(s) / ED Diagnoses Final diagnoses:  Left lower quadrant abdominal pain  Lower urinary tract infectious disease    Rx / DC Orders ED Discharge Orders          Ordered    cephALEXin (KEFLEX) 500 MG capsule  2 times daily     04/28/19 2304           Charlesetta Shanks, MD 05/03/19 [id number]

## 2019-04-28 NOTE — ED Triage Notes (Signed)
Pt c/o LLQ pain that started after a colonoscopy 2 days ago-states she was sent by [facility]-steady gait

## 2019-04-28 NOTE — Discharge Instructions (Addendum)
1.  Take Keflex as prescribed.  Your first dose was given in the emergency department.  You do not need to start it until tomorrow.  This is an antibiotic for urinary tract infection. 2.  Urine cultures are obtained to confirm urinary tract infections and identify what kind of bacteria are causing them.  You should notify your gynecologist or gastroenterologist to follow-up on these results which will be available in 24 to 48 hours.  You have been given antibiotics through the emergency department so you will not be called automatically with the results of urine culture. 3.  Follow-up with your gastroenterologist for recheck for abdominal pain.

## 2019-04-30 LAB — URINE CULTURE

## 2020-05-17 ENCOUNTER — Other Ambulatory Visit: Payer: Self-pay

## 2020-05-17 ENCOUNTER — Encounter: Payer: Self-pay | Admitting: Neurology

## 2020-05-17 DIAGNOSIS — R202 Paresthesia of skin: Secondary | ICD-10-CM

## 2020-07-03 ENCOUNTER — Other Ambulatory Visit: Payer: Self-pay

## 2020-07-03 ENCOUNTER — Ambulatory Visit: Payer: BC Managed Care – PPO | Admitting: Neurology

## 2020-07-03 DIAGNOSIS — R202 Paresthesia of skin: Secondary | ICD-10-CM

## 2020-07-03 NOTE — Procedures (Signed)
[facility]  [address], [address]  [place], [place] [address] Tel: [phone] Fax:  [phone] Test Date:  07/03/2020  Patient: Virginia Jensen DOB: 07/29/1960 Physician: Avonlea Sima, DO  Sex: Female Height: 5' 7" Ref Phys: W.Dan Caffrey, MD  ID#: [id number]   Technician:    Patient Complaints: This is a 59-year-old female referred for evaluation of right hand paresthesias.  NCV & EMG Findings: Extensive electrodiagnostic testing of the right upper extremity shows:  1. Right median, ulnar, and mixed palmar sensory responses are within normal limits. 2. Right median and ulnar motor responses are within normal limits. 3. There is no evidence of active or chronic motor axonal loss changes affecting any of the tested muscles.  Motor unit configuration and recruitment pattern is within normal limits.  Impression: This is a normal study of the right upper extremity.  In particular, there is no evidence of carpal tunnel syndrome, ulnar neuropathy, or a cervical radiculopathy.   ___________________________ Aum Caggiano, DO    Nerve Conduction Studies Anti Sensory Summary Table   Stim Site NR Peak (ms) Norm Peak (ms) P-T Amp (V) Norm P-T Amp  Right Median Anti Sensory (2nd Digit)  32C  Wrist    3.0 <3.6 29.3 >15  Right Ulnar Anti Sensory (5th Digit)  32C  Wrist    2.5 <3.1 26.8 >10   Motor Summary Table   Stim Site NR Onset (ms) Norm Onset (ms) O-P Amp (mV) Norm O-P Amp Site1 Site2 Delta-0 (ms) Dist (cm) Vel (m/s) Norm Vel (m/s)  Right Median Motor (Abd Poll Brev)  32C  Wrist    2.9 <4.0 8.2 >6 Elbow Wrist 4.8 30.0 62 >50  Elbow    7.7  7.8         Right Ulnar Motor (Abd Dig Minimi)  32C  Wrist    1.7 <3.1 11.5 >7 B Elbow Wrist 3.9 23.0 59 >50  B Elbow    5.6  11.2  A Elbow B Elbow 1.9 10.0 53 >50  A Elbow    7.5  10.8          Comparison Summary Table   Stim Site NR Peak (ms) Norm Peak (ms) P-T Amp (V) Site1 Site2 Delta-P (ms) Norm Delta (ms)   Right Median/Ulnar Palm Comparison (Wrist - 8cm)  32C  Median Palm    1.6 <2.2 46.4 Median Palm Ulnar Palm 0.1   Ulnar Palm    1.5 <2.2 16.5       EMG   Side Muscle Ins Act Fibs Psw Fasc Number Recrt Dur Dur. Amp Amp. Poly Poly. Comment  Right 1stDorInt Nml Nml Nml Nml Nml Nml Nml Nml Nml Nml Nml Nml N/A  Right PronatorTeres Nml Nml Nml Nml Nml Nml Nml Nml Nml Nml Nml Nml N/A  Right Biceps Nml Nml Nml Nml Nml Nml Nml Nml Nml Nml Nml Nml N/A  Right Triceps Nml Nml Nml Nml Nml Nml Nml Nml Nml Nml Nml Nml N/A  Right Deltoid Nml Nml Nml Nml Nml Nml Nml Nml Nml Nml Nml Nml N/A      Waveforms:          

## 2021-02-22 IMAGING — CT CT ABD-PELV W/ CM
2 of 5 series · 16 of 46 positions shown, 18 images · IV contrast (omnipaque)
Comparison: None.

CLINICAL DATA: Status post colonoscopy 1 day ago with left lower
quadrant pain

EXAM:
CT ABDOMEN AND PELVIS WITH CONTRAST
TECHNIQUE: Multidetector CT imaging of the abdomen and pelvis was performed
using the standard protocol following bolus administration of
intravenous contrast.
CONTRAST:  100mL OMNIPAQUE IOHEXOL 300 MG/ML  SOLN

[Series 2: axial st · axial · 0.81mm/px · z∈[-617,-157]mm · 13 of 104 slices shown, 15 images]
[im 6/104  soft-tissue]
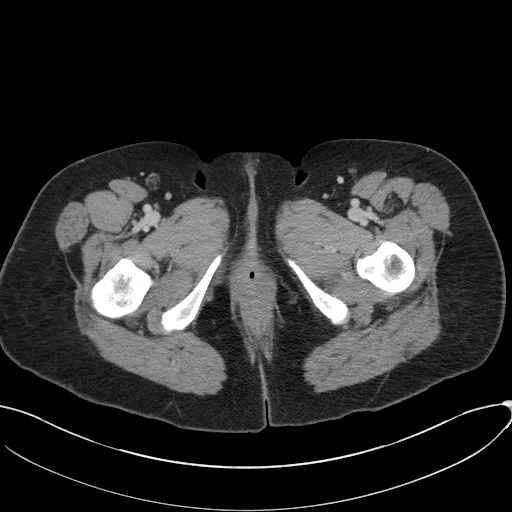
[im 6/104  bone]
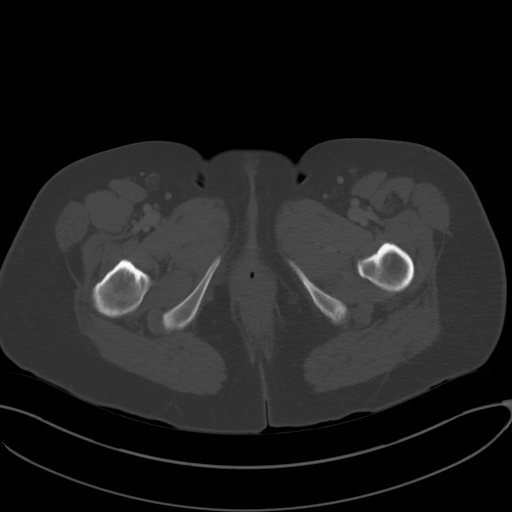
[im 12/104  soft-tissue]
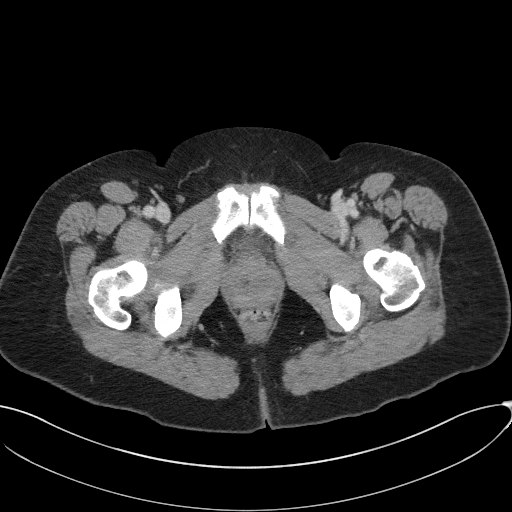
[im 23/104  soft-tissue]
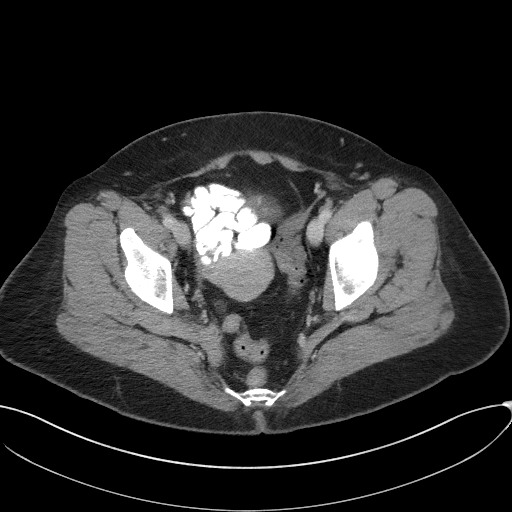
[im 29/104  soft-tissue]
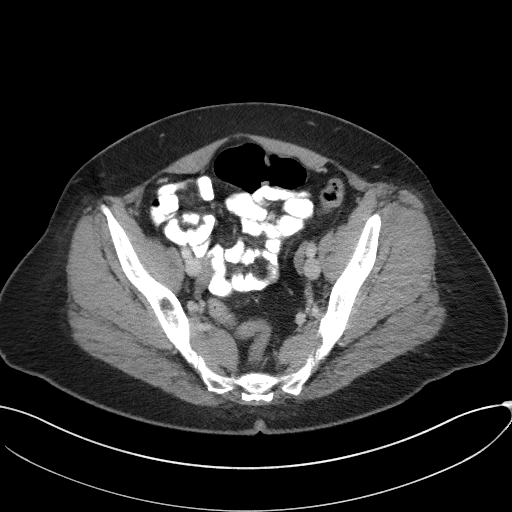
[im 35/104  soft-tissue]
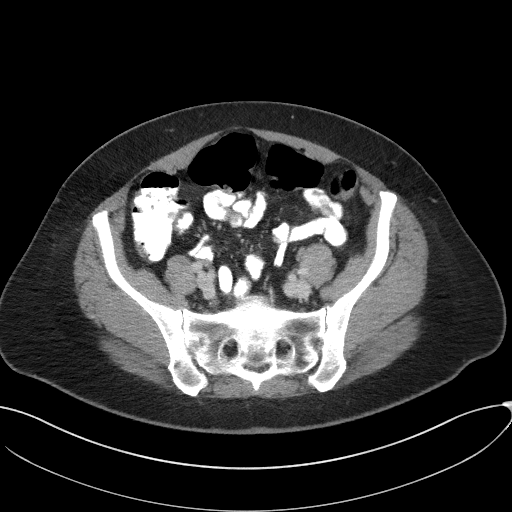
[im 46/104  soft-tissue]
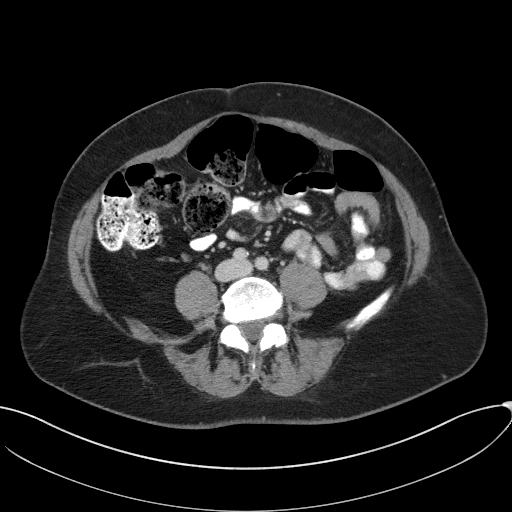
[im 52/104  soft-tissue]
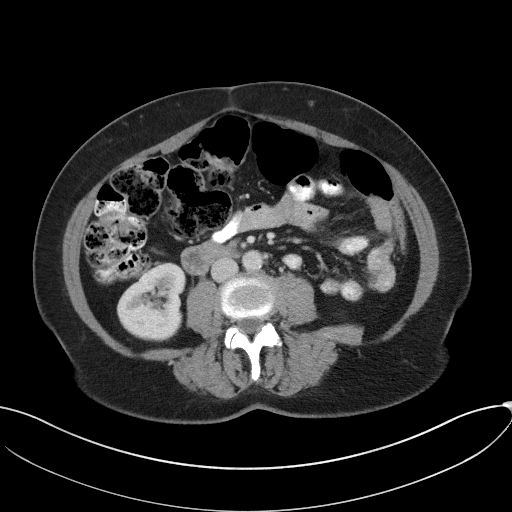
[im 58/104  soft-tissue]
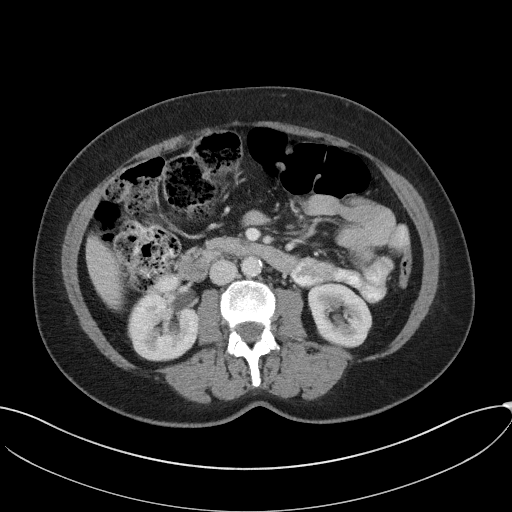
[im 69/104  soft-tissue]
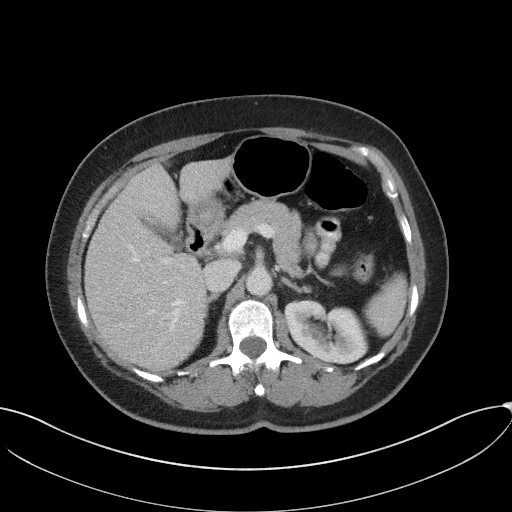
[im 69/104  bone]
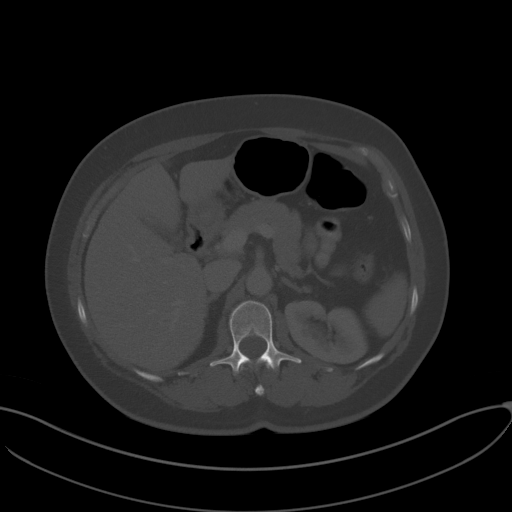
[im 75/104  soft-tissue]
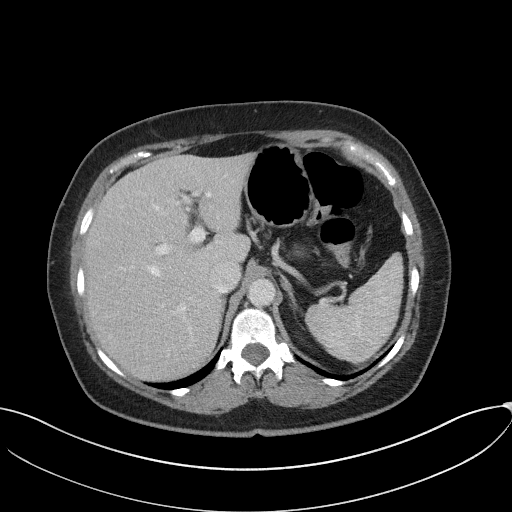
[im 81/104  soft-tissue]
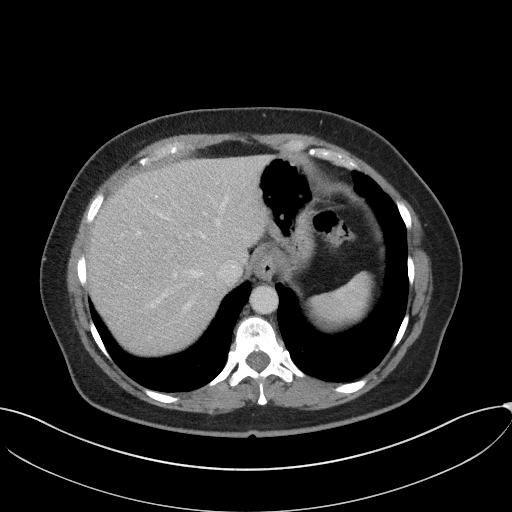
[im 92/104  soft-tissue]
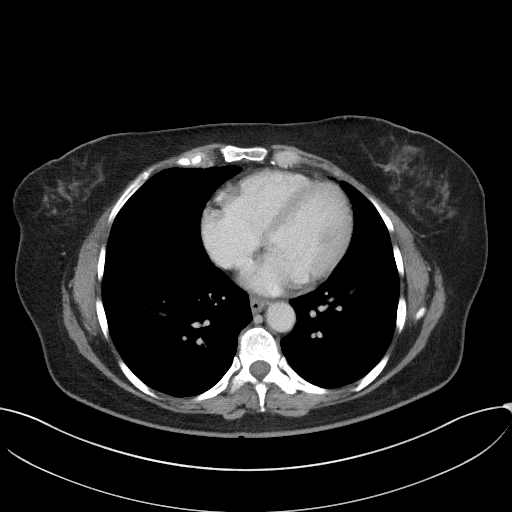
[im 98/104  soft-tissue]
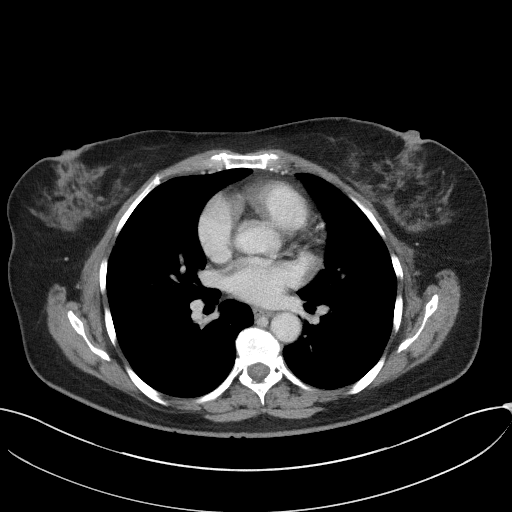

[Series 5: coronal st · coronal · 0.81mm/px · 3 of 90 slices shown]
[im 30/90  soft-tissue]
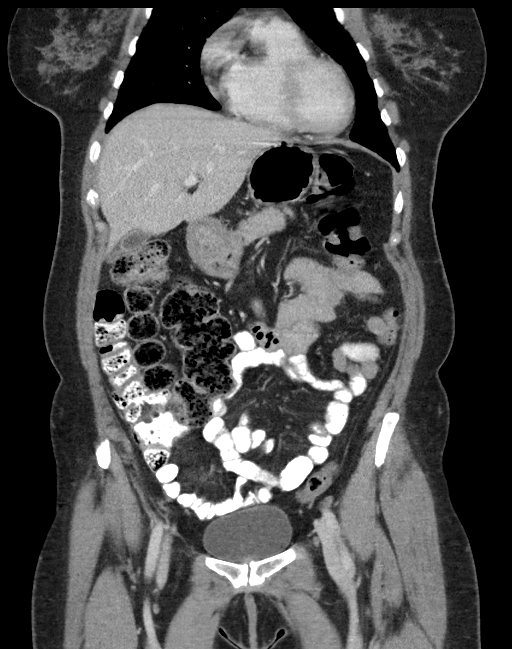
[im 40/90  soft-tissue]
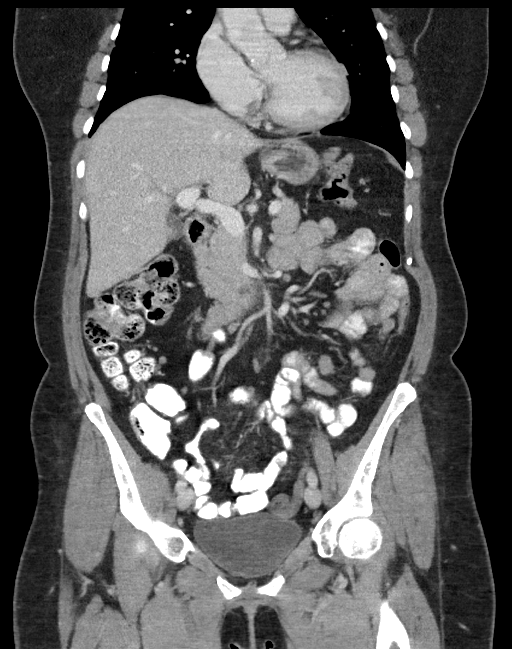
[im 50/90  soft-tissue]
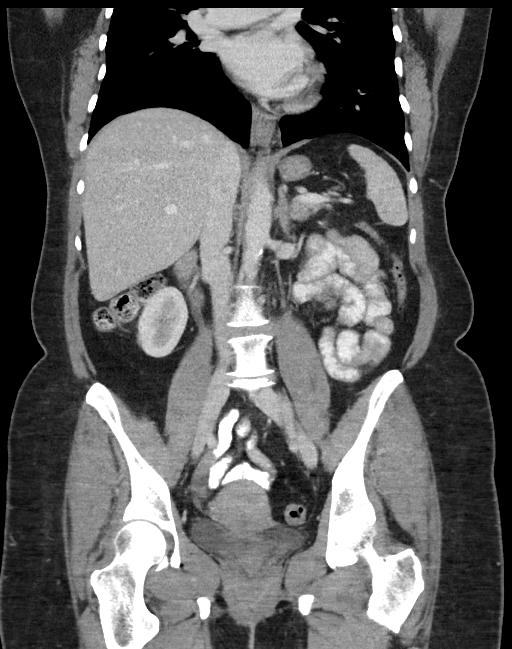

[16 of 46 positions shown; findings below may reference images not displayed]

FINDINGS: Lower chest: No acute abnormality.

Hepatobiliary: Fatty infiltration of the liver is noted. The
gallbladder is within normal limits.

Pancreas: Unremarkable. No pancreatic ductal dilatation or
surrounding inflammatory changes.

Spleen: Normal in size without focal abnormality.

Adrenals/Urinary Tract: Adrenal glands are within normal limits.
Kidneys demonstrate a normal enhancement pattern bilaterally. No
renal calculi or obstructive changes are seen. The bladder is well
distended.

Stomach/Bowel: No obstructive or inflammatory changes of the colon
are noted. The appendix is within normal limits. No obstructive or
inflammatory changes of the small bowel are noted. Small hiatal
hernia is noted.

Vascular/Lymphatic: Aortic atherosclerosis. No enlarged abdominal or
pelvic lymph nodes.

Reproductive: Uterus and bilateral adnexa are unremarkable.

Other: No abdominal wall hernia or abnormality. No abdominopelvic
ascites.

Musculoskeletal: No acute or significant osseous findings.
IMPRESSION: Fatty liver.

No acute abnormality noted.

## 2022-01-10 ENCOUNTER — Emergency Department (HOSPITAL_BASED_OUTPATIENT_CLINIC_OR_DEPARTMENT_OTHER)
Admission: EM | Admit: 2022-01-10 | Discharge: 2022-01-10 | Disposition: A | Discharge status: Home / Self Care | Payer: BC Managed Care – PPO | Attending: Emergency Medicine | Admitting: Emergency Medicine

## 2022-01-10 ENCOUNTER — Other Ambulatory Visit: Payer: Self-pay

## 2022-01-10 ENCOUNTER — Encounter (HOSPITAL_BASED_OUTPATIENT_CLINIC_OR_DEPARTMENT_OTHER): Payer: Self-pay | Admitting: Emergency Medicine

## 2022-01-10 ENCOUNTER — Emergency Department (HOSPITAL_BASED_OUTPATIENT_CLINIC_OR_DEPARTMENT_OTHER): Payer: BC Managed Care – PPO

## 2022-01-10 DIAGNOSIS — R079 Chest pain, unspecified: Secondary | ICD-10-CM | POA: Diagnosis present

## 2022-01-10 DIAGNOSIS — R0789 Other chest pain: Secondary | ICD-10-CM | POA: Diagnosis not present

## 2022-01-10 LAB — COMPREHENSIVE METABOLIC PANEL
ALT: 28 U/L (ref 0–44)
AST: 21 U/L (ref 15–41)
Albumin: 4.4 g/dL (ref 3.5–5.0)
Alkaline Phosphatase: 58 U/L (ref 38–126)
Anion gap: 6 (ref 5–15)
BUN: 13 mg/dL (ref 6–20)
CO2: 26 mmol/L (ref 22–32)
Calcium: 9.3 mg/dL (ref 8.9–10.3)
Chloride: 105 mmol/L (ref 98–111)
Creatinine, Ser: 0.72 mg/dL (ref 0.44–1.00)
GFR, Estimated: >60 mL/min (ref >60)
Glucose, Bld: 105 mg/dL — ABNORMAL HIGH (ref 70–99)
Potassium: 3.8 mmol/L (ref 3.5–5.1)
Sodium: 137 mmol/L (ref 135–145)
Total Bilirubin: 0.3 mg/dL (ref 0.3–1.2)
Total Protein: 7.8 g/dL (ref 6.5–8.1)

## 2022-01-10 LAB — CBC WITH DIFFERENTIAL/PLATELET
Abs Immature Granulocytes: 0.03 10*3/uL (ref 0.00–0.07)
Basophils Absolute: 0.1 10*3/uL (ref 0.0–0.1)
Basophils Relative: 1 %
Eosinophils Absolute: 0.3 10*3/uL (ref 0.0–0.5)
Eosinophils Relative: 4 %
HCT: 40.6 % (ref 36.0–46.0)
Hemoglobin: 13.5 g/dL (ref 12.0–15.0)
Immature Granulocytes: 0 %
Lymphocytes Relative: 30 %
Lymphs Abs: 2.3 10*3/uL (ref 0.7–4.0)
MCH: 30.1 pg (ref 26.0–34.0)
MCHC: 33.3 g/dL (ref 30.0–36.0)
MCV: 90.4 fL (ref 80.0–100.0)
Monocytes Absolute: 0.7 10*3/uL (ref 0.1–1.0)
Monocytes Relative: 10 %
Neutro Abs: 4.2 10*3/uL (ref 1.7–7.7)
Neutrophils Relative %: 55 %
Platelets: 268 10*3/uL (ref 150–400)
RBC: 4.49 MIL/uL (ref 3.87–5.11)
RDW: 13.2 % (ref 11.5–15.5)
WBC: 7.5 10*3/uL (ref 4.0–10.5)
nRBC: 0 % (ref 0.0–0.2)

## 2022-01-10 LAB — TROPONIN I (HIGH SENSITIVITY): Troponin I (High Sensitivity): 4 ng/L (ref <18)

## 2022-01-10 LAB — D-DIMER, QUANTITATIVE: D-Dimer, Quant: 0.28 ug/mL-FEU (ref 0.00–0.50)

## 2022-01-10 NOTE — ED Provider Notes (Signed)
[facility] Provider Note   CSN: [id number] Arrival date & time: 01/10/22  1437     History  Chief Complaint  Patient presents with   Chest Pain    Virginia Jensen is a 61 y.o. female here presenting with chest pain.  Patient states that she took down a tent at the art show about a week ago.  She states that since then she had some right-sided chest wall pain about 5 days ago.  She states that the pain resolved.  Today she noted having left-sided chest pain radiating to the left side of her back.  She states that she has no pain right now.  Denies any exertional chest pain.  Denies any abdominal pain.  Denies any personal history of CAD but has family history of CAD.  The history is provided by the patient.       Home Medications Prior to Admission medications   Medication Sig Start Date End Date Taking? Authorizing Provider  cephALEXin (KEFLEX) 500 MG capsule Take 2 capsules (1,000 mg total) by mouth 2 (two) times daily. 04/28/19   Charlesetta Shanks, MD  omeprazole (PRILOSEC) 20 MG capsule Take 1 capsule (20 mg total) by mouth 2 (two) times daily. 05/12/13   Tanna Furry, MD  ondansetron (ZOFRAN ODT) 4 MG disintegrating tablet Take 1 tablet (4 mg total) by mouth every 8 (eight) hours as needed for nausea. 05/12/13   Tanna Furry, MD      Allergies    Naproxen    Review of Systems   Review of Systems  Cardiovascular:  Positive for chest pain.  All other systems reviewed and are negative.   Physical Exam Updated Vital Signs BP 128/78   Pulse 70   Temp 97.9 F (36.6 C) (Oral)   Resp 20   Ht 5\' 7"  (1.702 m)   Wt 79.4 kg   SpO2 97%   BMI 27.41 kg/m  Physical Exam Vitals and nursing note reviewed.  Constitutional:      Appearance: She is well-developed.  HENT:     Head: Normocephalic.  Eyes:     Extraocular Movements: Extraocular movements intact.     Pupils: Pupils are equal, round, and reactive to light.  Cardiovascular:     Rate and  Rhythm: Normal rate and regular rhythm.     Heart sounds: Normal heart sounds.  Pulmonary:     Effort: Pulmonary effort is normal.     Breath sounds: Normal breath sounds.  Chest:     Comments: No obvious reproducible tenderness. Abdominal:     General: Bowel sounds are normal.     Palpations: Abdomen is soft.  Musculoskeletal:        General: Normal range of motion.     Cervical back: Normal range of motion and neck supple.  Skin:    General: Skin is warm.     Capillary Refill: Capillary refill takes less than 2 seconds.  Neurological:     General: No focal deficit present.     Mental Status: She is alert and oriented to person, place, and time.  Psychiatric:        Mood and Affect: Mood normal.        Behavior: Behavior normal.     ED Results / Procedures / Treatments   Labs (all labs ordered are listed, but only abnormal results are displayed) Labs Reviewed  COMPREHENSIVE METABOLIC PANEL - Abnormal; Notable for the following components:      Result Value  Glucose, Bld 105 (*)    All other components within normal limits  CBC WITH DIFFERENTIAL/PLATELET  D-DIMER, QUANTITATIVE  TROPONIN I (HIGH SENSITIVITY)  TROPONIN I (HIGH SENSITIVITY)    EKG EKG Interpretation  Date/Time:  Saturday January 10 2022 14:43:32 EDT Ventricular Rate:  79 PR Interval:  150 QRS Duration: 88 QT Interval:  364 QTC Calculation: 417 R Axis:   62 Text Interpretation: Sinus rhythm with occasional Premature ventricular complexes Nonspecific ST abnormality Abnormal ECG When compared with ECG of 12-May-2013 20:08, PREVIOUS ECG IS PRESENT No significant change since last tracing Confirmed by Wandra Arthurs [phone]) on 01/10/2022 4:23:29 PM  Radiology DG Chest 2 View  Result Date: 01/10/2022 CLINICAL DATA:  chest pain EXAM: CHEST - 2 VIEW COMPARISON:  May 12, 2013 FINDINGS: The cardiomediastinal silhouette is unchanged in contour. No pleural effusion. No pneumothorax. No acute  pleuroparenchymal abnormality. Visualized abdomen is unremarkable. Multilevel degenerative changes of the thoracic spine. IMPRESSION: No acute cardiopulmonary abnormality. Electronically Signed   By: Valentino Saxon M.D.   On: 01/10/2022 16:22    Procedures Procedures    Medications Ordered in ED Medications - No data to display  ED Course/ Medical Decision Making/ A&P                           Medical Decision Making Virginia Jensen is a 61 y.o. female here presenting with chest pain.  Patient took down a tent about a week ago and has intermittent chest pain since then.  I think likely musculoskeletal in nature.  Her pain was right-sided and now is left-sided.  Patient has no known CAD.  We will get troponin x2 and D-dimer to rule out ACS or PE.  I doubt dissection.   6:03 PM I reviewed patient's labs and independently interpreted imaging study.  EKG unremarkable and troponin negative x1 and D-dimer negative. Patient states that she would like to go home right now.  I explained to her that second troponin may be beneficial but she actually has pain for more than 24 hours now.  I told her that she can take some ibuprofen for pain.  She does not want any muscle relaxants.  She states that she can follow-up with her doctor and if she gets persistent pain she can get the stress test outpatient.  Problems Addressed: Chest wall pain: acute illness or injury  Amount and/or Complexity of Data Reviewed Labs: ordered. Decision-making details documented in ED Course. Radiology: ordered and independent interpretation performed. Decision-making details documented in ED Course. ECG/medicine tests: ordered and independent interpretation performed. Decision-making details documented in ED Course.    Final Clinical Impression(s) / ED Diagnoses Final diagnoses:  None    Rx / DC Orders ED Discharge Orders     None         Drenda Freeze, MD 01/10/22 [phone]

## 2022-01-10 NOTE — Discharge Instructions (Addendum)
Take Motrin or Tylenol for pain.  Avoid any heavy lifting  As we discussed, if you have persistent chest pain, I recommend getting stress test with your doctor  Follow-up with your doctor  Return to ER if you have worse chest pain, shortness of breath.

## 2022-01-10 NOTE — ED Triage Notes (Signed)
Patient c/o chest pain onset yesterday. Pain started on right side, then went to left back and now is under left breast.

## 2022-05-07 ENCOUNTER — Ambulatory Visit (HOSPITAL_COMMUNITY)
Admission: RE | Admit: 2022-05-07 | Discharge: 2022-05-07 | Disposition: A | Discharge status: Home / Self Care | Payer: BC Managed Care – PPO | Source: Ambulatory Visit | Attending: Orthopedic Surgery | Admitting: Orthopedic Surgery

## 2022-05-07 ENCOUNTER — Other Ambulatory Visit (HOSPITAL_COMMUNITY): Payer: Self-pay | Admitting: Orthopedic Surgery

## 2022-05-07 DIAGNOSIS — M79605 Pain in left leg: Secondary | ICD-10-CM

## 2022-05-07 NOTE — Progress Notes (Signed)
Left lower extremity venous duplex has been completed. Preliminary results can be found in CV Proc through chart review.  Results were given to Dr. French Ana.  05/07/22 11:28 AM Virginia Jensen RVT
